# Patient Record
Sex: Female | Born: 1960 | Race: White | Hispanic: No | Marital: Married | Smoking: Never smoker
Health system: Southern US, Community
[De-identification: ages and names within clinical notes are randomized; demographics above are authoritative.]

---

## 2009-07-22 ENCOUNTER — Emergency Department (HOSPITAL_BASED_OUTPATIENT_CLINIC_OR_DEPARTMENT_OTHER): Admission: EM | Admit: 2009-07-22 | Discharge: 2009-07-22 | Payer: Self-pay | Admitting: Emergency Medicine

## 2012-10-31 ENCOUNTER — Encounter (HOSPITAL_COMMUNITY): Payer: Self-pay | Admitting: *Deleted

## 2012-10-31 ENCOUNTER — Emergency Department (HOSPITAL_COMMUNITY)
Admission: EM | Admit: 2012-10-31 | Discharge: 2012-10-31 | Disposition: A | Discharge status: Home / Self Care | Payer: Managed Care, Other (non HMO) | Attending: Emergency Medicine | Admitting: Emergency Medicine

## 2012-10-31 DIAGNOSIS — IMO0002 Reserved for concepts with insufficient information to code with codable children: Secondary | ICD-10-CM | POA: Insufficient documentation

## 2012-10-31 DIAGNOSIS — T5894XA Toxic effect of carbon monoxide from unspecified source, undetermined, initial encounter: Secondary | ICD-10-CM | POA: Insufficient documentation

## 2012-10-31 DIAGNOSIS — Y92009 Unspecified place in unspecified non-institutional (private) residence as the place of occurrence of the external cause: Secondary | ICD-10-CM | POA: Insufficient documentation

## 2012-10-31 DIAGNOSIS — Z7729 Contact with and (suspected ) exposure to other hazardous substances: Secondary | ICD-10-CM

## 2012-10-31 DIAGNOSIS — T588X1A Toxic effect of carbon monoxide from other source, accidental (unintentional), initial encounter: Secondary | ICD-10-CM | POA: Insufficient documentation

## 2012-10-31 DIAGNOSIS — Y9301 Activity, walking, marching and hiking: Secondary | ICD-10-CM | POA: Insufficient documentation

## 2012-10-31 DIAGNOSIS — W1809XA Striking against other object with subsequent fall, initial encounter: Secondary | ICD-10-CM | POA: Insufficient documentation

## 2012-10-31 DIAGNOSIS — R5381 Other malaise: Secondary | ICD-10-CM | POA: Insufficient documentation

## 2012-10-31 DIAGNOSIS — S0993XA Unspecified injury of face, initial encounter: Secondary | ICD-10-CM | POA: Insufficient documentation

## 2012-10-31 DIAGNOSIS — S199XXA Unspecified injury of neck, initial encounter: Secondary | ICD-10-CM | POA: Insufficient documentation

## 2012-10-31 DIAGNOSIS — R42 Dizziness and giddiness: Secondary | ICD-10-CM | POA: Insufficient documentation

## 2012-10-31 DIAGNOSIS — R51 Headache: Secondary | ICD-10-CM | POA: Insufficient documentation

## 2012-10-31 DIAGNOSIS — R110 Nausea: Secondary | ICD-10-CM | POA: Insufficient documentation

## 2012-10-31 LAB — CARBOXYHEMOGLOBIN: O2 Saturation: 64 %

## 2012-10-31 LAB — TROPONIN I: Troponin I: <0.3 ng/mL (ref <0.30)

## 2012-10-31 NOTE — ED Notes (Signed)
Patient woke up at 0530 today per her usual routine, noted that she felt sick.  Concerned that the generator in the garage was making them sick.  She had near syncope.  Continues to have dizziness upon arrival to ED.  Patient initial co reading was 35, zero on last check per ems.  Patient with no complaints of chest pain.  She does have an injury to her lower lip
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
## 2012-10-31 NOTE — ED Notes (Signed)
Poison control given an update.

## 2012-10-31 NOTE — ED Notes (Signed)
Poison control notified of pt situation.  Pt is to be placed on O2 and have cardiac enzymes and carboxyhemoglobin checked.  Pt needs EKG.  If no symptoms and no ataxia at the end of four hours, pt may be discharged home.  MD aware.

## 2012-10-31 NOTE — ED Provider Notes (Signed)
Medical screening examination/treatment/procedure(s) were performed by non-physician practitioner and as supervising physician I was immediately available for consultation/collaboration.  Doug Sou, MD 10/31/12 [phone]

## 2012-10-31 NOTE — ED Notes (Signed)
PA aware that pt denies any further symptoms and has no ataxia.

## 2012-10-31 NOTE — ED Notes (Signed)
Poison control notified of pt lab values.  No change in POC. 

## 2012-10-31 NOTE — ED Notes (Signed)
Pulse ox is 100 percent on nonrebreather.  No complaints of pain/headache at this time.  States she is feeling "great"

## 2012-10-31 NOTE — ED Notes (Signed)
Panic level carboxyhemaglobin of 7.9 reported to MD,  Patient remains on high flow oxygen and monitoring.

## 2016-02-01 MED FILL — MINOCYCLINE 100 MG CAPSULE: 100 | 30 days supply | Qty: 60 | Fill #0

## 2019-07-14 ENCOUNTER — Ambulatory Visit: Payer: Self-pay

## 2019-07-14 ENCOUNTER — Ambulatory Visit (INDEPENDENT_AMBULATORY_CARE_PROVIDER_SITE_OTHER): Payer: 59 | Admitting: Family Medicine

## 2019-07-14 ENCOUNTER — Other Ambulatory Visit: Payer: Self-pay

## 2019-07-14 ENCOUNTER — Encounter: Payer: Self-pay | Admitting: Family Medicine

## 2019-07-14 VITALS — BP 149/90 | Ht 65.0 in | Wt 160.0 lb

## 2019-07-14 DIAGNOSIS — M79674 Pain in right toe(s): Secondary | ICD-10-CM | POA: Diagnosis not present

## 2019-07-14 MED ORDER — COLCHICINE 0.6 MG PO TABS: 0.6000 mg | ORAL_TABLET | Freq: Two times a day (BID) | ORAL | 2 refills | Status: AC

## 2019-07-14 MED ORDER — PENNSAID 2 % EX SOLN: 1.0000 "application " | Freq: Two times a day (BID) | CUTANEOUS | 3 refills | Status: AC

## 2019-07-14 NOTE — Patient Instructions (Signed)
Nice to meet you Please try the colchicine. Please send me a message if you don't have improvement after 3-4 days.  Please try the rub on medicine  Please try ice   Please send me a message in MyChart with any questions or updates.  Please see me back in 4 weeks.   --Dr. Raeford Razor

## 2019-07-14 NOTE — Progress Notes (Signed)
Robin Terry - 58 y.o. female MRN [id number]  Date of birth: 1961-01-28  SUBJECTIVE:  Including CC & ROS.  Chief Complaint  Patient presents with  . Toe Pain    right Great toe    Robin Terry is a 58 y.o. female that is presenting with right great toe pain.  The pain is been ongoing since April.  She feels it was secondary to trauma at that time.  She has pain with any touch.  That is red and swollen.  No improvement with modalities to date.  No history of gout.  Pain is sharp and throbbing.  Is intermittent in nature.  Pain can be moderate to severe.  Worse with ambulation.   Review of Systems  Constitutional: Negative for fever.  HENT: Negative for congestion.   Respiratory: Negative for cough.   Cardiovascular: Negative for chest pain.  Gastrointestinal: Negative for abdominal pain.  Musculoskeletal: Positive for joint swelling.  Skin: Negative for color change.  Neurological: Negative for weakness.  Hematological: Negative for adenopathy.    HISTORY: Past Medical, Surgical, Social, and Family History Reviewed & Updated per EMR.   Pertinent Historical Findings include:  No past medical history on file.  No past surgical history on file.  No Known Allergies  No family history on file.   Social History   Socioeconomic History  . Marital status: Married    Spouse name: Not on file  . Number of children: Not on file  . Years of education: Not on file  . Highest education level: Not on file  Occupational History  . Not on file  Social Needs  . Financial resource strain: Not on file  . Food insecurity    Worry: Not on file    Inability: Not on file  . Transportation needs    Medical: Not on file    Non-medical: Not on file  Tobacco Use  . Smoking status: Never Smoker  . Smokeless tobacco: Never Used  Substance and Sexual Activity  . Alcohol use: Yes  . Drug use: No  . Sexual activity: Not on file  Lifestyle  . Physical activity    Days per  week: Not on file    Minutes per session: Not on file  . Stress: Not on file  Relationships  . Social Herbalist on phone: Not on file    Gets together: Not on file    Attends religious service: Not on file    Active member of club or organization: Not on file    Attends meetings of clubs or organizations: Not on file    Relationship status: Not on file  . Intimate partner violence    Fear of current or ex partner: Not on file    Emotionally abused: Not on file    Physically abused: Not on file    Forced sexual activity: Not on file  Other Topics Concern  . Not on file  Social History Narrative  . Not on file     PHYSICAL EXAM:  VS: BP (!) 149/90   Ht 5\' 5"  (1.651 m)   Wt 160 lb (72.6 kg)   BMI 26.63 kg/m  Physical Exam Gen: NAD, alert, cooperative with exam, well-appearing ENT: normal lips, normal nasal mucosa,  Eye: normal EOM, normal conjunctiva and lids CV:  no edema, +2 pedal pulses   Resp: no accessory muscle use, non-labored,   Skin: no rashes, no areas of induration  Neuro: normal tone, normal sensation to  touch Psych:  normal insight, alert and oriented MSK:  Right foot: Swelling and redness of the first MTP joint. Limited flexion and extension. Tenderness to palpation over the dorsal compartment of the first MTP joint. No streaking. No ecchymosis. Neurovascularly intact  Limited ultrasound: Right great toe:  Degenerative changes appreciated the first MTP joint.  There appears to be a mild effusion.  Significant increased hyperemia surrounding the joint. No fracture appreciated of the phalanx  Summary: Findings suggestive of inflammatory process possibly related to gout  Ultrasound and interpretation by Clare Gandy, MD    ASSESSMENT & PLAN:   Great toe pain, right Pain seems to be associated with an inflammatory process of the MTP joint.  No fracture appreciated.  Possibly for gout origin. -Colchicine. -If no improvement will  consider injection or prednisone. -Counseled on insoles. -Could consider uric acid

## 2019-07-14 NOTE — Assessment & Plan Note (Signed)
Pain seems to be associated with an inflammatory process of the MTP joint.  No fracture appreciated.  Possibly for gout origin. -Colchicine. -If no improvement will consider injection or prednisone. -Counseled on insoles. -Could consider uric acid

## 2019-08-11 ENCOUNTER — Telehealth: Payer: Self-pay | Admitting: Family Medicine

## 2019-08-11 ENCOUNTER — Ambulatory Visit (INDEPENDENT_AMBULATORY_CARE_PROVIDER_SITE_OTHER): Payer: Managed Care, Other (non HMO) | Admitting: Family Medicine

## 2019-08-11 ENCOUNTER — Other Ambulatory Visit: Payer: Self-pay

## 2019-08-11 ENCOUNTER — Ambulatory Visit (HOSPITAL_BASED_OUTPATIENT_CLINIC_OR_DEPARTMENT_OTHER)
Admission: RE | Admit: 2019-08-11 | Discharge: 2019-08-11 | Disposition: A | Discharge status: Home / Self Care | Payer: Managed Care, Other (non HMO) | Source: Ambulatory Visit | Attending: Family Medicine | Admitting: Family Medicine

## 2019-08-11 ENCOUNTER — Encounter: Payer: Self-pay | Admitting: Family Medicine

## 2019-08-11 VITALS — BP 141/90 | Ht 66.0 in | Wt 155.0 lb

## 2019-08-11 DIAGNOSIS — M79674 Pain in right toe(s): Secondary | ICD-10-CM | POA: Insufficient documentation

## 2019-08-11 MED ORDER — PREDNISONE 20 MG PO TABS: ORAL_TABLET | ORAL | 0 refills | Status: AC
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
## 2019-08-11 NOTE — Telephone Encounter (Signed)
Informed of results.   Rosemarie Ax, MD [facility] 08/11/2019, 2:46 PM

## 2019-08-11 NOTE — Assessment & Plan Note (Signed)
No improvement with colchicine.  Less likely for gout.  Did occur after the initial trauma with now ongoing inflammatory change. -Prednisone. -X-ray. -May need to consider posting to help with cushioning of the joint.

## 2020-09-16 ENCOUNTER — Encounter (HOSPITAL_BASED_OUTPATIENT_CLINIC_OR_DEPARTMENT_OTHER): Payer: Self-pay | Admitting: *Deleted

## 2020-09-16 ENCOUNTER — Emergency Department (HOSPITAL_BASED_OUTPATIENT_CLINIC_OR_DEPARTMENT_OTHER)
Admission: EM | Admit: 2020-09-16 | Discharge: 2020-09-16 | Disposition: A | Discharge status: Home / Self Care | Payer: 59 | Attending: Emergency Medicine | Admitting: Emergency Medicine

## 2020-09-16 ENCOUNTER — Emergency Department (HOSPITAL_BASED_OUTPATIENT_CLINIC_OR_DEPARTMENT_OTHER): Payer: 59

## 2020-09-16 ENCOUNTER — Other Ambulatory Visit: Payer: Self-pay

## 2020-09-16 DIAGNOSIS — R42 Dizziness and giddiness: Secondary | ICD-10-CM | POA: Diagnosis not present

## 2020-09-16 DIAGNOSIS — R000 Tachycardia, unspecified: Secondary | ICD-10-CM | POA: Diagnosis not present

## 2020-09-16 DIAGNOSIS — T40711A Poisoning by cannabis, accidental (unintentional), initial encounter: Secondary | ICD-10-CM | POA: Insufficient documentation

## 2020-09-16 DIAGNOSIS — R112 Nausea with vomiting, unspecified: Secondary | ICD-10-CM | POA: Diagnosis not present

## 2020-09-16 DIAGNOSIS — E876 Hypokalemia: Secondary | ICD-10-CM | POA: Insufficient documentation

## 2020-09-16 DIAGNOSIS — R55 Syncope and collapse: Secondary | ICD-10-CM | POA: Diagnosis not present

## 2020-09-16 DIAGNOSIS — Z20822 Contact with and (suspected) exposure to covid-19: Secondary | ICD-10-CM | POA: Insufficient documentation

## 2020-09-16 DIAGNOSIS — R531 Weakness: Secondary | ICD-10-CM | POA: Diagnosis present

## 2020-09-16 LAB — RAPID URINE DRUG SCREEN, HOSP PERFORMED
Amphetamines: NOT DETECTED
Barbiturates: NOT DETECTED
Benzodiazepines: NOT DETECTED
Cocaine: NOT DETECTED
Opiates: NOT DETECTED
Tetrahydrocannabinol: POSITIVE — AB

## 2020-09-16 LAB — CBC WITH DIFFERENTIAL/PLATELET
Abs Immature Granulocytes: 0.03 10*3/uL (ref 0.00–0.07)
Basophils Absolute: 0 10*3/uL (ref 0.0–0.1)
Basophils Relative: 0 %
Eosinophils Absolute: 0.1 10*3/uL (ref 0.0–0.5)
Eosinophils Relative: 2 %
HCT: 42 % (ref 36.0–46.0)
Hemoglobin: 14 g/dL (ref 12.0–15.0)
Immature Granulocytes: 0 %
Lymphocytes Relative: 27 %
Lymphs Abs: 2 10*3/uL (ref 0.7–4.0)
MCH: 28.8 pg (ref 26.0–34.0)
MCHC: 33.3 g/dL (ref 30.0–36.0)
MCV: 86.4 fL (ref 80.0–100.0)
Monocytes Absolute: 0.6 10*3/uL (ref 0.1–1.0)
Monocytes Relative: 7 %
Neutro Abs: 4.9 10*3/uL (ref 1.7–7.7)
Neutrophils Relative %: 64 %
Platelets: 315 10*3/uL (ref 150–400)
RBC: 4.86 MIL/uL (ref 3.87–5.11)
RDW: 12.9 % (ref 11.5–15.5)
WBC: 7.6 10*3/uL (ref 4.0–10.5)
nRBC: 0 % (ref 0.0–0.2)

## 2020-09-16 LAB — URINALYSIS, ROUTINE W REFLEX MICROSCOPIC
Bilirubin Urine: NEGATIVE
Glucose, UA: NEGATIVE mg/dL
Hgb urine dipstick: NEGATIVE
Ketones, ur: NEGATIVE mg/dL
Leukocytes,Ua: NEGATIVE
Nitrite: NEGATIVE
Protein, ur: NEGATIVE mg/dL
Specific Gravity, Urine: 1.005 (ref 1.005–1.030)
pH: 7.5 (ref 5.0–8.0)

## 2020-09-16 LAB — COMPREHENSIVE METABOLIC PANEL
ALT: 29 U/L (ref 0–44)
AST: 25 U/L (ref 15–41)
Albumin: 4.3 g/dL (ref 3.5–5.0)
Alkaline Phosphatase: 96 U/L (ref 38–126)
Anion gap: 13 (ref 5–15)
BUN: 16 mg/dL (ref 6–20)
CO2: 24 mmol/L (ref 22–32)
Calcium: 9.3 mg/dL (ref 8.9–10.3)
Chloride: 99 mmol/L (ref 98–111)
Creatinine, Ser: 0.86 mg/dL (ref 0.44–1.00)
GFR, Estimated: >60 mL/min (ref >60)
Glucose, Bld: 133 mg/dL — ABNORMAL HIGH (ref 70–99)
Potassium: 2.9 mmol/L — ABNORMAL LOW (ref 3.5–5.1)
Sodium: 136 mmol/L (ref 135–145)
Total Bilirubin: 0.4 mg/dL (ref 0.3–1.2)
Total Protein: 7.3 g/dL (ref 6.5–8.1)

## 2020-09-16 LAB — LACTIC ACID, PLASMA: Lactic Acid, Venous: 1.8 mmol/L (ref 0.5–1.9)

## 2020-09-16 LAB — TROPONIN I (HIGH SENSITIVITY)
Troponin I (High Sensitivity): 2 ng/L (ref <18)
Troponin I (High Sensitivity): 3 ng/L (ref <18)

## 2020-09-16 LAB — MAGNESIUM: Magnesium: 1.9 mg/dL (ref 1.7–2.4)

## 2020-09-16 LAB — SARS CORONAVIRUS 2 BY RT PCR (HOSPITAL ORDER, PERFORMED IN ~~LOC~~ HOSPITAL LAB): SARS Coronavirus 2: NEGATIVE

## 2020-09-16 MED ORDER — IOHEXOL 350 MG/ML SOLN
100.0000 mL | Freq: Once | INTRAVENOUS | Status: AC | PRN
  Administered 2020-09-16: 70 mL via INTRAVENOUS

## 2020-09-16 MED ORDER — POTASSIUM CHLORIDE CRYS ER 20 MEQ PO TBCR
60.0000 meq | EXTENDED_RELEASE_TABLET | Freq: Once | ORAL | Status: AC
  Administered 2020-09-16: 60 meq via ORAL
  Filled 2020-09-16: qty 3

## 2020-09-16 MED ORDER — POTASSIUM CHLORIDE 10 MEQ/100ML IV SOLN
10.0000 meq | INTRAVENOUS | Status: AC
  Administered 2020-09-16 (×2): 10 meq via INTRAVENOUS
  Filled 2020-09-16 (×2): qty 100

## 2020-09-16 MED ORDER — SODIUM CHLORIDE 0.9 % IV SOLN
INTRAVENOUS | Status: DC | PRN
  Administered 2020-09-16: 500 mL via INTRAVENOUS

## 2020-09-16 MED ORDER — MAGNESIUM SULFATE IN D5W 1-5 GM/100ML-% IV SOLN: 1.0000 g | Freq: Once | INTRAVENOUS | Status: DC

## 2020-09-16 MED ORDER — POTASSIUM CHLORIDE CRYS ER 20 MEQ PO TBCR: 50.0000 meq | EXTENDED_RELEASE_TABLET | Freq: Once | ORAL | Status: DC

## 2020-09-16 MED ORDER — SODIUM CHLORIDE 0.9 % IV BOLUS
1000.0000 mL | Freq: Once | INTRAVENOUS | Status: AC
  Administered 2020-09-16: 1000 mL via INTRAVENOUS

## 2020-09-16 MED ORDER — POTASSIUM CHLORIDE ER 10 MEQ PO TBCR: 10.0000 meq | EXTENDED_RELEASE_TABLET | Freq: Every day | ORAL | 0 refills | Status: AC

## 2020-09-16 MED ORDER — ONDANSETRON HCL 4 MG/2ML IJ SOLN
4.0000 mg | Freq: Once | INTRAMUSCULAR | Status: AC
  Administered 2020-09-16: 4 mg via INTRAVENOUS
  Filled 2020-09-16: qty 2

## 2020-09-16 NOTE — Discharge Instructions (Addendum)
Like we discussed, I am going to prescribe you 2 weeks of a potassium supplement.  Please take this once a day for the next 2 weeks and follow-up with your regular doctor to have your potassium levels rechecked.  You can always return to the emergency department if you develop any new or worsening symptoms.  It was a pleasure to meet you.

## 2020-09-16 NOTE — ED Notes (Signed)
BEFAST/ VAN NEGATIVE 
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
## 2020-09-16 NOTE — ED Notes (Signed)
Patient transported to CT 

## 2020-09-16 NOTE — ED Notes (Signed)
Pt c/o nausea. Provider made aware.  

## 2020-09-16 NOTE — ED Triage Notes (Signed)
Feeling light headed several hours ago. Was cooking breakfast. Denies LOC. No HA, No Nausea or Vomiting, states she ate Hemp Seeds with frozen fruit

## 2020-09-16 NOTE — ED Notes (Signed)
Placed on cont cardiac monitor with ECG performed. IV established, placed in supine position. Blood tubes obtained, liter of IV NS initiated. Safety measures in place

## 2020-09-16 NOTE — ED Notes (Addendum)
ED Provider at bedside. Pt's O2 sats noted to drop briefly to 88-91% on room air x several episodes lasting ~30 sec to 1 min then back to 97-100%. Pulse ox showing good waveform during these times. Dr. Dalene Seltzer made aware and at bedside

## 2020-09-16 NOTE — ED Notes (Signed)
ED Provider at bedside. 

## 2020-09-16 NOTE — ED Provider Notes (Signed)
[facility] EMERGENCY DEPARTMENT Provider Note   CSN: [id number] Arrival date & time: 09/16/20  1243     History Chief Complaint  Patient presents with  . Dizziness    Robin Terry is a 60 y.o. female.  HPI Patient is a [age] year old female with no pertinent medical history.  Patient states that this morning she woke up feeling normal.  She then was cleaning out the cupboard and noticed that she had leftover hemp seed as well as flaxseed.  She decided to use this to make a smoothie.  She felt normal afterwards and then made breakfast for she and her husband.  While she was making breakfast she began feeling extremely weak.  Reports nausea, fatigue, lightheadedness, as well as a feeling of near syncope.  Denies any headache or vomiting.  No history of similar symptoms.  Patient states she takes turmeric regularly and started taking omeprazole about 9 days ago.  Otherwise, does not take any regular medications.  Patient drinks wine occasionally but states she has not done so all week.  No drug use.  Reports associated tingling in all 4 extremities and now reports some right sided chest pain.  No numbness.  Patient has been vaccinated for COVID-19.  Denies any URI symptoms.  No recent illnesses.    History reviewed. No pertinent past medical history.  Patient Active Problem List   Diagnosis Date Noted  . Great toe pain, right 07/14/2019    History reviewed. No pertinent surgical history.   OB History   No obstetric history on file.     History reviewed. No pertinent family history.  Social History   Tobacco Use  . Smoking status: Never Smoker  . Smokeless tobacco: Never Used  Substance Use Topics  . Alcohol use: Yes  . Drug use: No    Home Medications Prior to Admission medications   Medication Sig Start Date End Date Taking? Authorizing Provider  potassium chloride (KLOR-CON) 10 MEQ tablet Take 1 tablet (10 mEq total) by mouth daily for 14 days. 09/16/20  09/30/20 Yes Placido SouJoldersma, Shekelia Boutin, PA-C  CALCIUM PO Take 1 tablet by mouth daily.    [provider]  colchicine 0.6 MG tablet Take 1 tablet (0.6 mg total) by mouth 2 (two) times daily. 07/14/19   Myra RudeSchmitz, Jeremy E, MD  Diclofenac Sodium (PENNSAID) 2 % SOLN Place 1 application onto the skin 2 (two) times daily. 07/14/19   Myra RudeSchmitz, Jeremy E, MD  fish oil-omega-3 fatty acids 1000 MG capsule Take 1 g by mouth daily.    [provider]  predniSONE (DELTASONE) 20 MG tablet TAKE 3 TABS ONCE DAILY FOR 3 DAYS, 2 FOR 3 DAYS, 1 FOR 2 DAYS THEN 1/2 FOR 2 DAYS 08/11/19   Myra RudeSchmitz, Jeremy E, MD    Allergies    Patient has no known allergies.  Review of Systems   Review of Systems  Constitutional: Positive for fatigue. Negative for chills and fever.  HENT: Negative for congestion.   Respiratory: Negative for cough and shortness of breath.   Cardiovascular: Negative for chest pain.  Gastrointestinal: Positive for nausea. Negative for abdominal pain and vomiting.  Neurological: Positive for weakness and light-headedness. Negative for dizziness, facial asymmetry, numbness and headaches.  Psychiatric/Behavioral: Negative for confusion.   Physical Exam Updated Vital Signs BP 106/62   Pulse (!) 118   Temp 98 F (36.7 C) (Oral)   Resp 17   Ht 5\' 5"  (1.651 m)   Wt 72.6 kg   SpO2 99%  BMI 26.63 kg/m   Physical Exam Vitals and nursing note reviewed.  Constitutional:      General: She is not in acute distress.    Appearance: Normal appearance. She is not ill-appearing, toxic-appearing or diaphoretic.  HENT:     Head: Normocephalic and atraumatic.     Right Ear: External ear normal.     Left Ear: External ear normal.     Nose: Nose normal.     Mouth/Throat:     Mouth: Mucous membranes are moist.     Pharynx: Oropharynx is clear. No oropharyngeal exudate or posterior oropharyngeal erythema.  Eyes:     Extraocular Movements: Extraocular movements intact.  Cardiovascular:     Rate  and Rhythm: Regular rhythm. Tachycardia present.     Pulses: Normal pulses.     Heart sounds: Normal heart sounds. No murmur heard. No friction rub. No gallop.   Pulmonary:     Effort: Pulmonary effort is normal. No respiratory distress.     Breath sounds: Normal breath sounds. No stridor. No wheezing, rhonchi or rales.  Abdominal:     General: Abdomen is flat.     Palpations: Abdomen is soft.     Tenderness: There is no abdominal tenderness.  Musculoskeletal:        General: Normal range of motion.     Cervical back: Normal range of motion and neck supple. No tenderness.  Skin:    General: Skin is warm and dry.  Neurological:     General: No focal deficit present.     Mental Status: She is alert and oriented to person, place, and time.     Comments: Appears extremely fatigued.  Has difficulty staying awake.  Patient is oriented to person, place, and time. Patient phonates in clear, complete, and coherent sentences but does speak slowly.  Strength is 5/5 in all four extremities. Distal sensation intact in all four extremities.  No facial droop.   Psychiatric:        Mood and Affect: Mood normal.        Behavior: Behavior normal.    ED Results / Procedures / Treatments   Labs (all labs ordered are listed, but only abnormal results are displayed) Labs Reviewed  COMPREHENSIVE METABOLIC PANEL - Abnormal; Notable for the following components:      Result Value   Potassium 2.9 (*)    Glucose, Bld 133 (*)    All other components within normal limits  RAPID URINE DRUG SCREEN, HOSP PERFORMED - Abnormal; Notable for the following components:   Tetrahydrocannabinol POSITIVE (*)    All other components within normal limits  SARS CORONAVIRUS 2 BY RT PCR (HOSPITAL ORDER, PERFORMED IN [facility])  CBC WITH DIFFERENTIAL/PLATELET  URINALYSIS, ROUTINE W REFLEX MICROSCOPIC  LACTIC ACID, PLASMA  MAGNESIUM  TROPONIN I (HIGH SENSITIVITY)  TROPONIN I (HIGH SENSITIVITY)   EKG EKG  Interpretation  Date/Time:  Saturday September 16 2020 13:14:29 EST Ventricular Rate:  110 PR Interval:    QRS Duration: 87 QT Interval:  327 QTC Calculation: 443 R Axis:   91 Text Interpretation: Sinus tachycardia Borderline right axis deviation Low voltage, precordial leads ST elevation, consider inferior injury Baseline wander in lead(s) III aVL No significant change since last tracing Confirmed by Alvira Monday ([id number]) on 09/16/2020 1:53:37 PM  Radiology CT Head Wo Contrast  Result Date: 09/16/2020 CLINICAL DATA:  Headache, dizziness EXAM: CT HEAD WITHOUT CONTRAST TECHNIQUE: Contiguous axial images were obtained from the base of the skull through the  vertex without intravenous contrast. COMPARISON:  None. FINDINGS: Brain: No evidence of acute infarction, hemorrhage, hydrocephalus, extra-axial collection or mass lesion/mass effect. Low lying cerebellar tonsils. Vascular: No hyperdense vessel or unexpected calcification. Skull: Normal. Negative for fracture or focal lesion. Sinuses/Orbits: No acute finding. Other: None. IMPRESSION: 1. No acute intracranial findings. 2. Low lying cerebellar tonsils, most often an asymptomatic incidental finding. Electronically Signed   By: Duanne GuessNicholas  Plundo D.O.   On: 09/16/2020 15:22   CT Angio Chest PE W and/or Wo Contrast  Result Date: 09/16/2020 CLINICAL DATA:  Shortness of breath, dizziness EXAM: CT ANGIOGRAPHY CHEST WITH CONTRAST TECHNIQUE: Multidetector CT imaging of the chest was performed using the standard protocol during bolus administration of intravenous contrast. Multiplanar CT image reconstructions and MIPs were obtained to evaluate the vascular anatomy. CONTRAST:  70mL OMNIPAQUE IOHEXOL 350 MG/ML SOLN COMPARISON:  None. FINDINGS: Cardiovascular: Satisfactory opacification of the bilateral pulmonary arteries to the segmental level. No evidence of pulmonary embolism. Although not tailored for evaluation of the thoracic aorta, there is no evidence of  thoracic aortic aneurysm or dissection. The heart is normal in size.  No pericardial effusion. Mediastinum/Nodes: No suspicious mediastinal lymphadenopathy. Visualized thyroid is unremarkable. Lungs/Pleura: Very mild biapical pleural-parenchymal scarring. Evaluation of the lung parenchyma is constrained by respiratory motion. Within that constraint, there are no suspicious pulmonary nodules. No focal consolidation. No pleural effusion or pneumothorax. Upper Abdomen: Visualized upper abdomen is notable for a small hiatal hernia. Musculoskeletal: Bilateral breast augmentation. Visualized osseous structures are within normal limits. Review of the MIP images confirms the above findings. IMPRESSION: No evidence of pulmonary embolism. Negative CT chest. Electronically Signed   By: Charline BillsSriyesh  Krishnan M.D.   On: 09/16/2020 15:21   Procedures Procedures (including critical care time)  Medications Ordered in ED Medications  0.9 %  sodium chloride infusion ( Intravenous Stopped 09/16/20 1617)  sodium chloride 0.9 % bolus 1,000 mL (0 mLs Intravenous Stopped 09/16/20 1432)  ondansetron (ZOFRAN) injection 4 mg (4 mg Intravenous Given 09/16/20 1358)  potassium chloride 10 mEq in 100 mL IVPB ( Intravenous Stopped 09/16/20 1731)  potassium chloride SA (KLOR-CON) CR tablet 60 mEq (60 mEq Oral Given 09/16/20 1447)  iohexol (OMNIPAQUE) 350 MG/ML injection 100 mL (70 mLs Intravenous Contrast Given 09/16/20 1452)   ED Course  I have reviewed the triage vital signs and the nursing notes.  Pertinent labs & imaging results that were available during my care of the patient were reviewed by me and considered in my medical decision making (see chart for details).  Clinical Course as of 09/16/20 1856  Sat Sep 16, 2020  1559 60 yo previously healthy female presenting to ED with fatigue, lightheadedness, chest pressure.  Reports she woke up feeling well, had a smoothie this morning that included hemp seeds (unusual for her), and an  hour or two later began to feel lightheaded, exhausted.  She had difficulty breathing on arrival in the ED.  Initially she was tachycardic and appeared very worn out to ED staff, sleeping in bed.  On my exam at 330 pm, she is more alert, feeling more alert.  No focal neuro deficits.  No conjunctival pallor.  She continues to be in sinus tachycardia on telemetry with HR 110-120 bpm and regular, no murmur on exam.  Lungs CTAB.  She has no acute complaints other than general fatigue.  Workup included labs and CT PE (negative for PE) and CTH negative.  K minor low at 2.9, repleting here. Also giving IV fluids.  Trop 2, will need repeat as she had some chest pressure.  At this point I most suspect this may be hemp toxicity, and would anticipate clinical improvement after observation for a few hours longer in the ED.  However i've also asked for a covid swab, as this may yet be a viral syndrome.  I have a lower suspicion for bacterial sepsis or stroke. [MT]  1602 She reports she had covid in Nov 2020 and has had at least one J&J vaccine, but cannot recall if she got any additional vaccines. [MT]  1604 On review of medical records, last 3 office visits from 2017-2020 for annual exam had documented HR of 84-87 bpm. [MT]  1832 Tetrahydrocannabinol(!): POSITIVE [LJ]    Clinical Course User Index [LJ] Placido Sou, PA-C [MT] Renaye Rakers, Kermit Balo, MD   MDM Rules/Calculators/A&P                          Pt is a 60 y.o. female the presented to the emergency department due to chest pain, fatigue, lightheadedness.  Labs: CBC benign. CMP shows hypokalemia 2.9.  This was repleted with Klor-Con as well as IV potassium.  Glucose of 133.  Otherwise, benign. Lactic acid of 1.8. Troponin of 3. COVID-19 test is negative. UA is benign. UDS positive for THC.  Imaging: CTA of the chest is negative. CT of the head shows no acute intracranial findings.  ECG: Sinus tachycardia Borderline right axis deviation Low  voltage, precordial leads ST elevation, consider inferior injury Baseline wander in lead(s) III aVL No significant change since last tracing  I, Placido Sou, PA-C, personally reviewed and evaluated these images and lab results as part of my medical decision-making.  Patient's neurological exam was benign upon arrival.  She has been tachycardic persistently since arrival.  Initially diaphoretic and difficult to arouse but A&O x4.  Initially was concerned of ACS versus PE versus CVA.  CTA of the chest was negative.  No elevation in troponin.  ECG generally reassuring.  Patient found to be hypokalemic and this was repleted with Klor-Con as well as IV potassium.  Mentation has been improving and patient appears much more awake.  Patient denies any history of drug use but upon further questioning she notes that she recently had a relative passed away and she had chronic pain issues.  She states that they cleaned out her refrigerator and placed many baked goods from her relatives fridge in her fridge.  She actually ate a couple of brownies from these baked goods this morning.  Her UDS came back positive for THC.  Patient as well as her husband are adamant that they have no history of marijuana use.  Feel that her symptoms are likely secondary to accidental ingestion of marijuana.  We also discussed her potassium levels in length.  We will discharge her on 2 weeks of potassium and recommended that she follow-up with her PCP for recheck.  Return to the emergency department with new or worsening symptoms.  Her questions were answered and she was amicable at the time of discharge.  Patient discussed with and evaluated by my attending physician Dr. Alvester Chou who is in agreement with the above plan.  Note: Portions of this report may have been transcribed using voice recognition software. Every effort was made to ensure accuracy; however, inadvertent computerized transcription errors may be present.   Final  Clinical Impression(s) / ED Diagnoses Final diagnoses:  Accidental cannabis overdose, initial encounter  Hypokalemia   Rx / DC Orders ED Discharge Orders         Ordered    potassium chloride (KLOR-CON) 10 MEQ tablet  Daily        09/16/20 1854           Placido Sou, PA-C 09/16/20 1856    Alvira Monday, MD 09/18/20 1124

## 2020-09-16 NOTE — ED Notes (Addendum)
ED Provider at bedside. [place], [place]

## 2021-04-14 IMAGING — CT CT HEAD W/O CM
3 series · 16 of 47 positions shown, 19 images · non-contrast
Comparison: None.

CLINICAL DATA: Headache, dizziness

EXAM:
CT HEAD WITHOUT CONTRAST
TECHNIQUE: Contiguous axial images were obtained from the base of the skull
through the vertex without intravenous contrast.

[Series 2: head wo · axial · 0.43mm/px · z∈[-152,-27]mm · 10 of 31 slices shown, 13 images]
[im 3/31  brain]
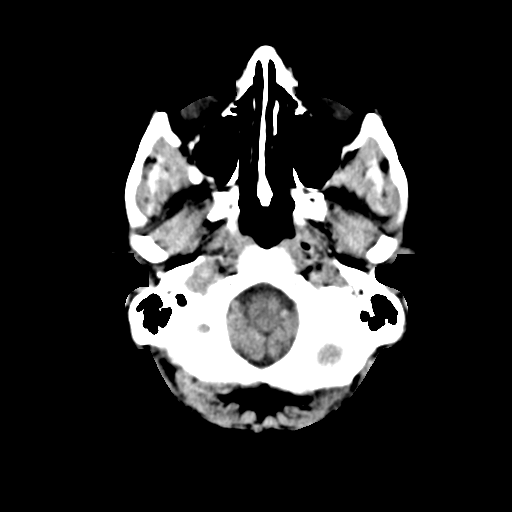
[im 3/31  bone]
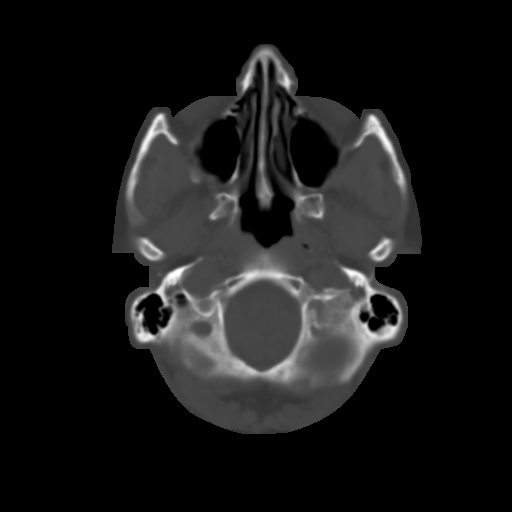
[im 6/31  brain]
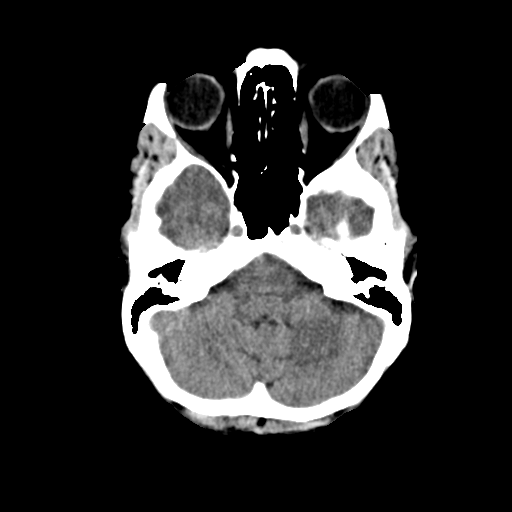
[im 9/31  brain]
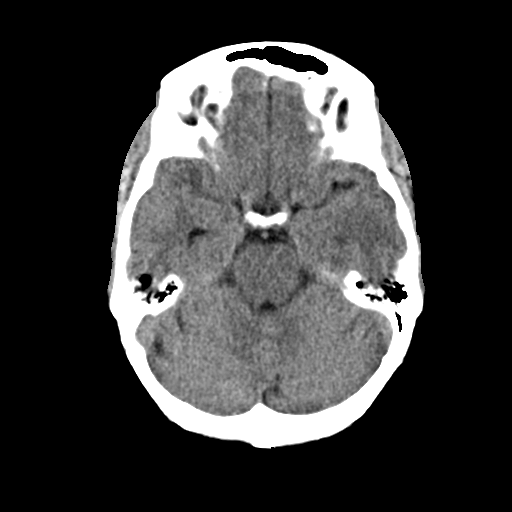
[im 11/31  brain]
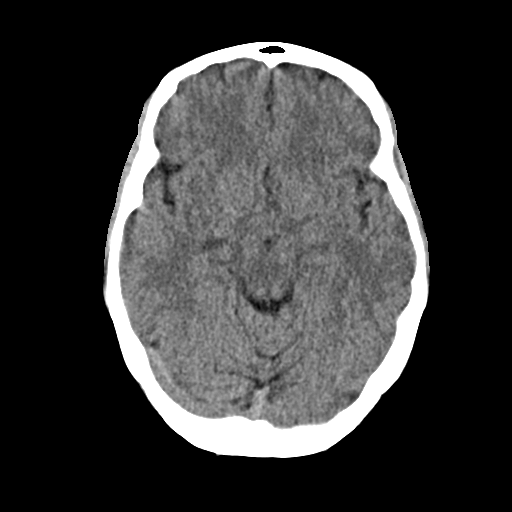
[im 14/31  brain]
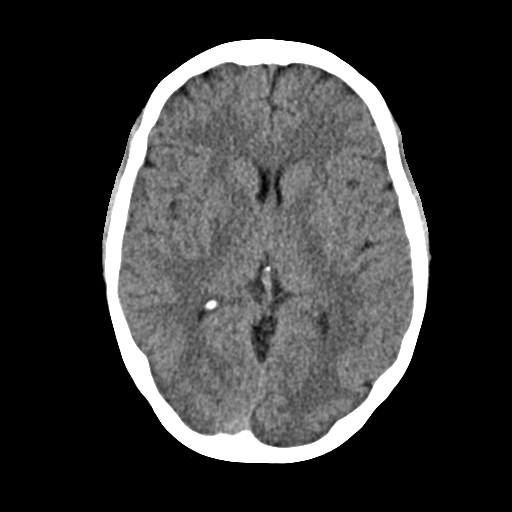
[im 14/31  bone]
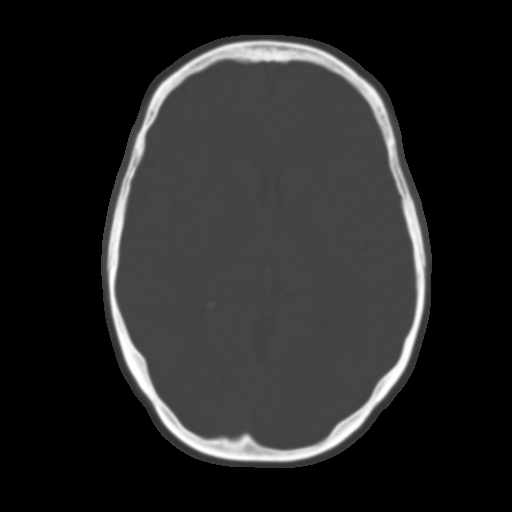
[im 17/31  brain]
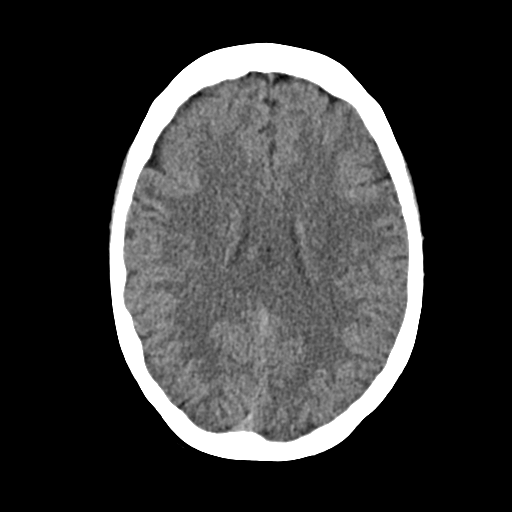
[im 20/31  brain]
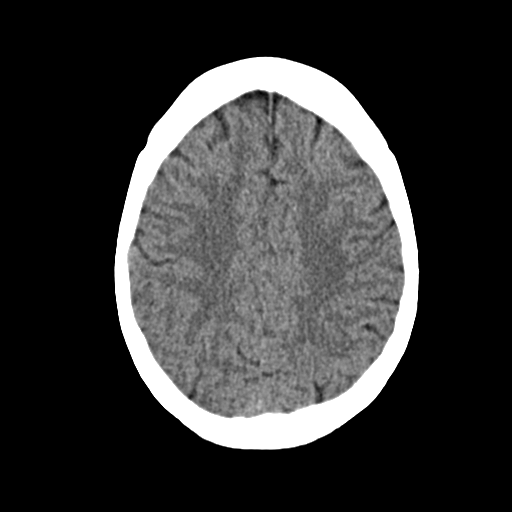
[im 23/31  brain]
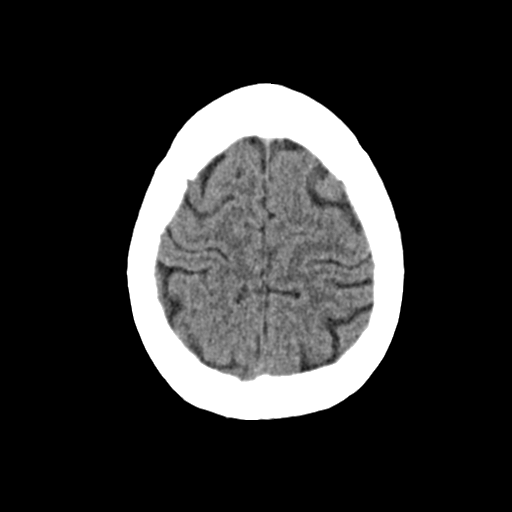
[im 25/31  brain]
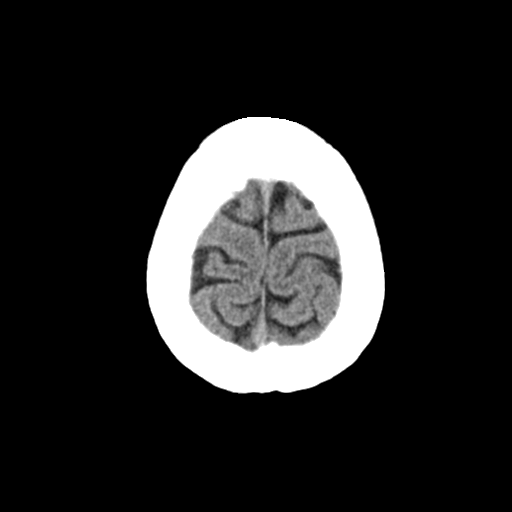
[im 25/31  bone]
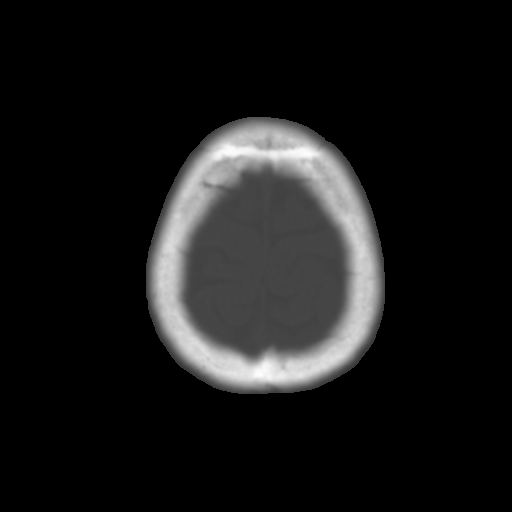
[im 28/31  brain]
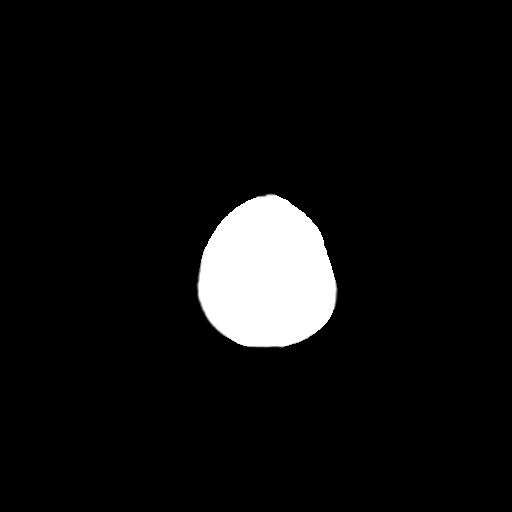

[Series 4: coronal soft · coronal · 0.30mm/px · 3 of 66 slices shown]
[im 22/66  brain]
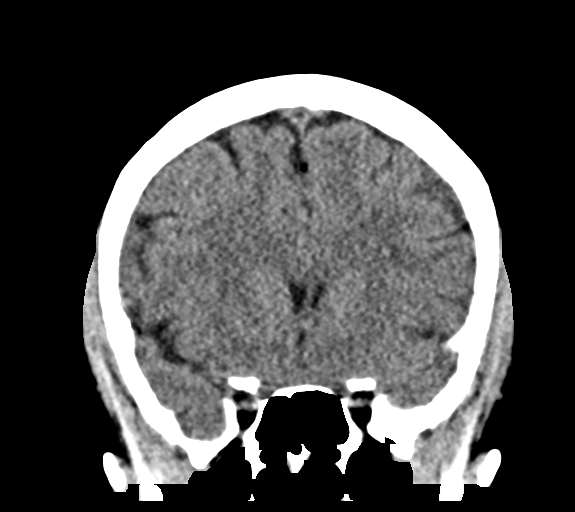
[im 29/66  brain]
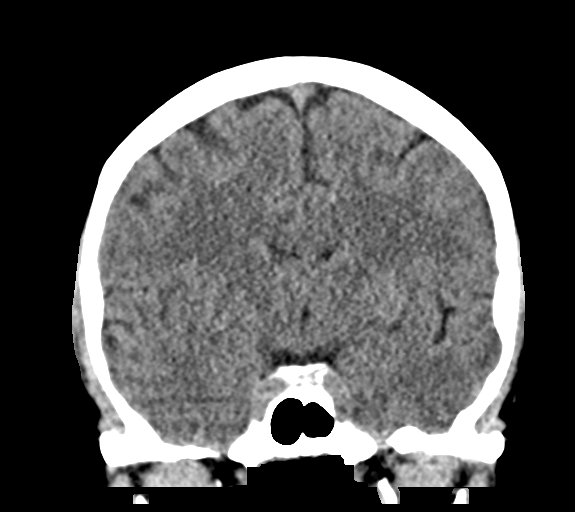
[im 37/66  brain]
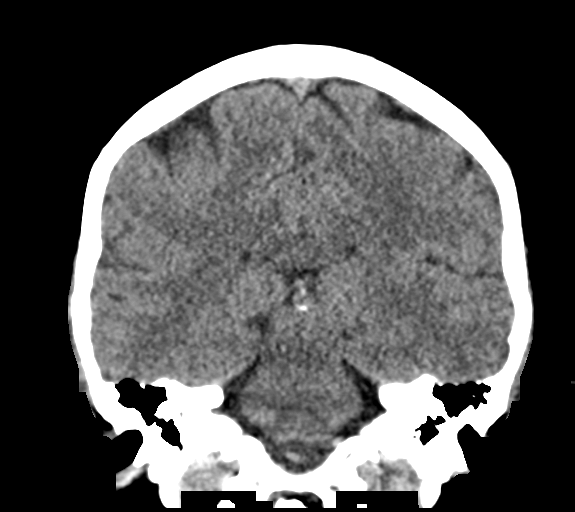

[Series 5: sag soft · sagittal · 0.30mm/px · 3 of 52 slices shown]
[im 18/52  brain]
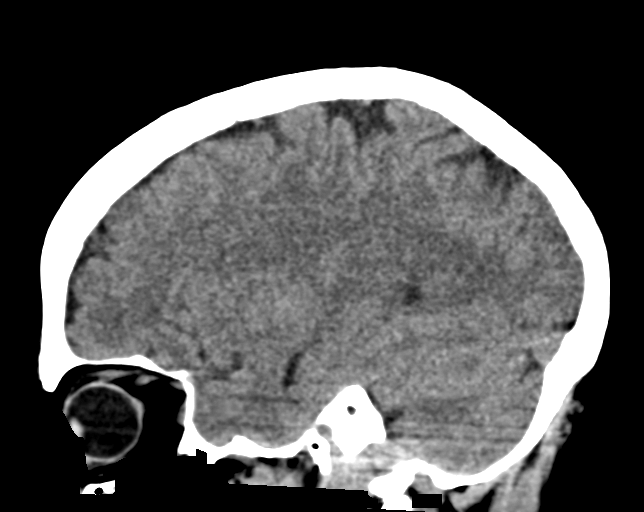
[im 26/52  brain]
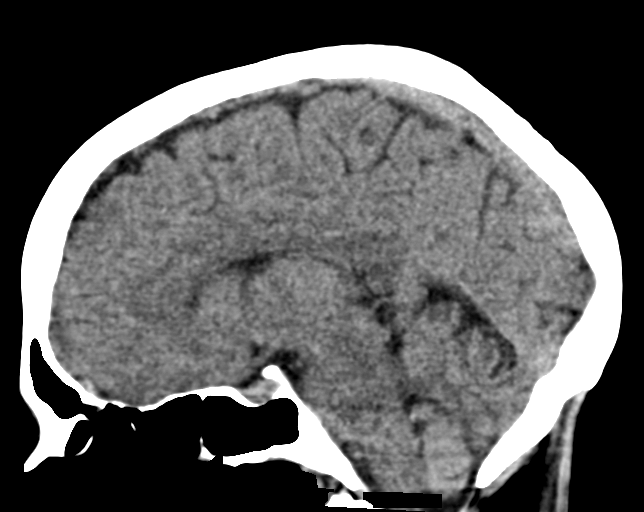
[im 35/52  brain]
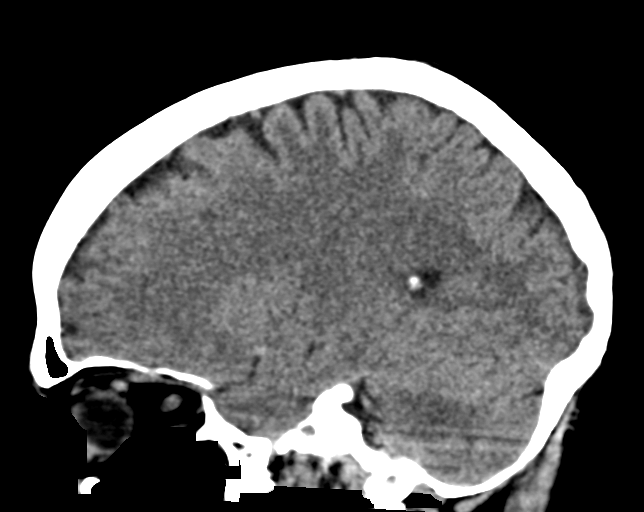

[16 of 47 positions shown; findings below may reference images not displayed]

FINDINGS: Brain: No evidence of acute infarction, hemorrhage, hydrocephalus,
extra-axial collection or mass lesion/mass effect. Low lying
cerebellar tonsils.

Vascular: No hyperdense vessel or unexpected calcification.

Skull: Normal. Negative for fracture or focal lesion.

Sinuses/Orbits: No acute finding.

Other: None.
IMPRESSION: 1. No acute intracranial findings.
2. Low lying cerebellar tonsils, most often an asymptomatic
incidental finding.

## 2022-12-10 ENCOUNTER — Encounter: Payer: Self-pay | Admitting: *Deleted
